# Patient Record
Sex: Female | Born: 1965 | Race: Asian | Hispanic: No | Marital: Married | Smoking: Never smoker
Health system: Southern US, Community
[De-identification: ages and names within clinical notes are randomized; demographics above are authoritative.]

## PROBLEM LIST (undated history)

## (undated) DIAGNOSIS — I10 Essential (primary) hypertension: Secondary | ICD-10-CM

## (undated) DIAGNOSIS — E785 Hyperlipidemia, unspecified: Secondary | ICD-10-CM

## (undated) DIAGNOSIS — K219 Gastro-esophageal reflux disease without esophagitis: Secondary | ICD-10-CM

## (undated) HISTORY — PX: NO PAST SURGERIES: SHX2092

## (undated) HISTORY — DX: Gastro-esophageal reflux disease without esophagitis: K21.9

## (undated) HISTORY — DX: Essential (primary) hypertension: I10

## (undated) HISTORY — DX: Hyperlipidemia, unspecified: E78.5

---

## 2018-02-27 ENCOUNTER — Other Ambulatory Visit: Payer: Self-pay | Admitting: Internal Medicine

## 2018-02-27 DIAGNOSIS — R921 Mammographic calcification found on diagnostic imaging of breast: Secondary | ICD-10-CM

## 2018-03-05 ENCOUNTER — Ambulatory Visit
Admission: RE | Admit: 2018-03-05 | Discharge: 2018-03-05 | Disposition: A | Discharge status: Home / Self Care | Payer: BLUE CROSS/BLUE SHIELD | Source: Ambulatory Visit | Attending: Internal Medicine | Admitting: Internal Medicine

## 2018-03-05 DIAGNOSIS — R921 Mammographic calcification found on diagnostic imaging of breast: Secondary | ICD-10-CM

## 2018-03-08 ENCOUNTER — Other Ambulatory Visit: Payer: Self-pay | Admitting: General Surgery

## 2019-01-24 IMAGING — MG STEREOTACTIC CORE NEEDLE BIOPSY
6 series · 6 of 6 positions shown · non-contrast
Comparison: Previous exams.

ADDENDUM:
Pathology revealed FIBROCYSTIC CHANGES WITH CALCIFICATIONS of the
Left breast, lower outer. This was found to be concordant by Dr.
Neva Artiga.

Pathology results were discussed with the patient by telephone by
Sosup Tejoo, [REDACTED] at [facility] [[email or web address]].
The patient was instructed to return for annual screening
mammography. Imaging and pathology reports were faxed to [facility] on March 08, 2018.
Pathology results reported by Paige Del Villar, RN on 03/08/2018.
CLINICAL DATA: 51-year-old female with suspicious calcifications in
the lower outer left breast.
EXAM:
LEFT BREAST STEREOTACTIC CORE NEEDLE BIOPSY

[L (1 of 6)]
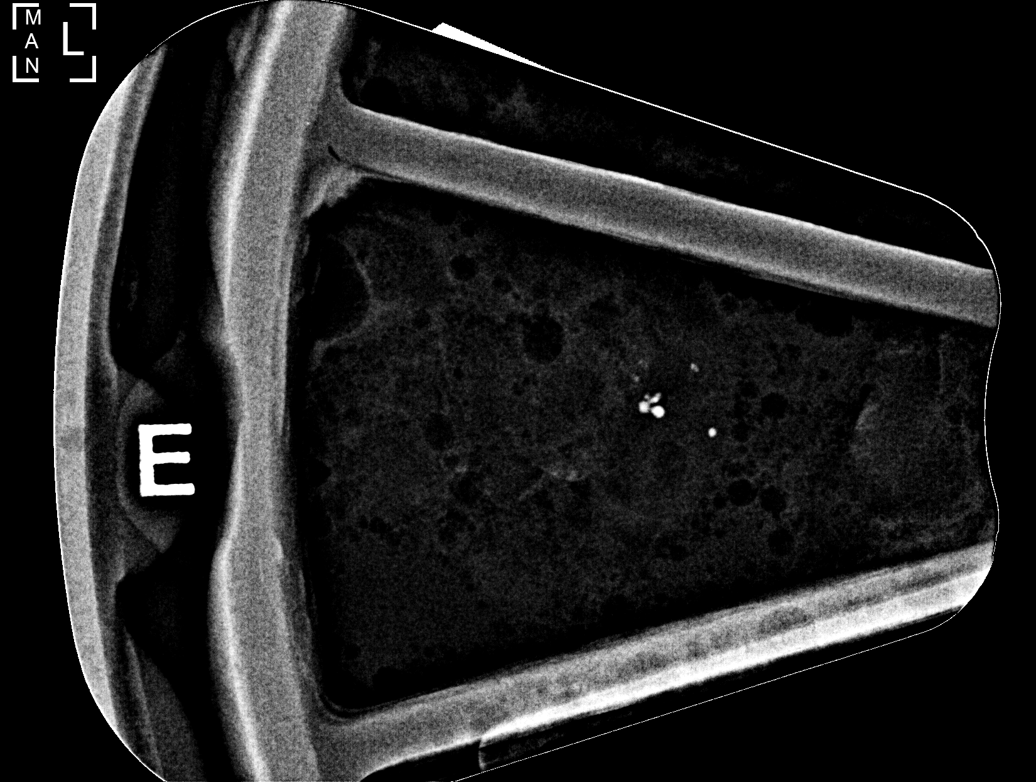

[L (2 of 6)]
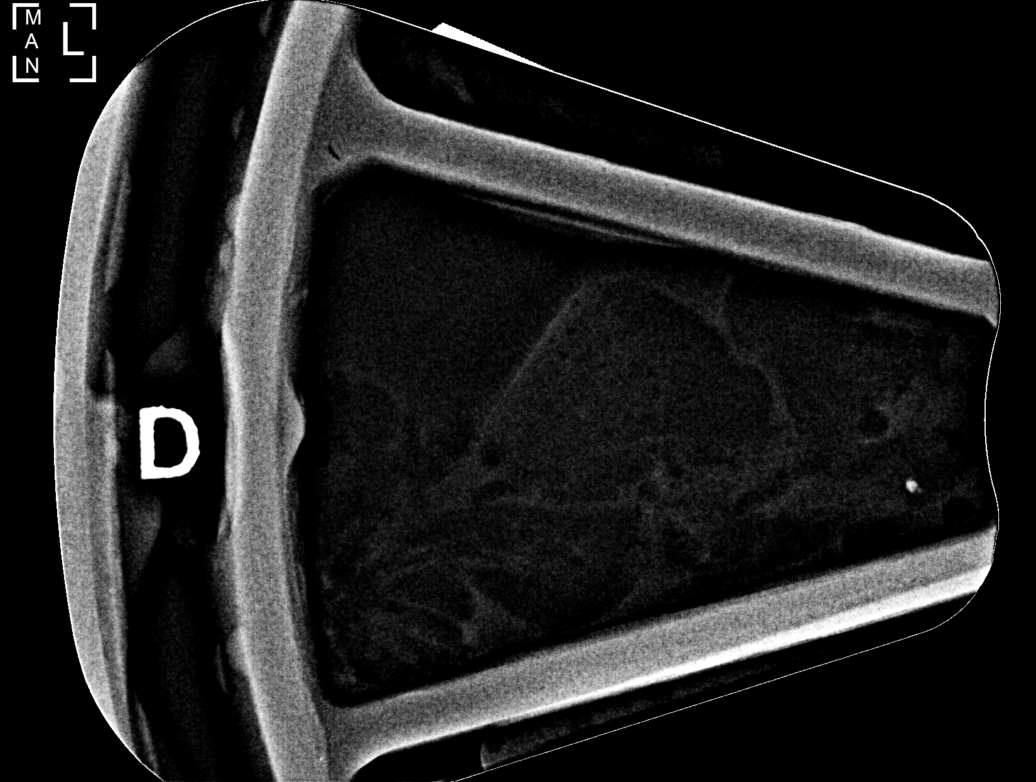

[L (3 of 6)]
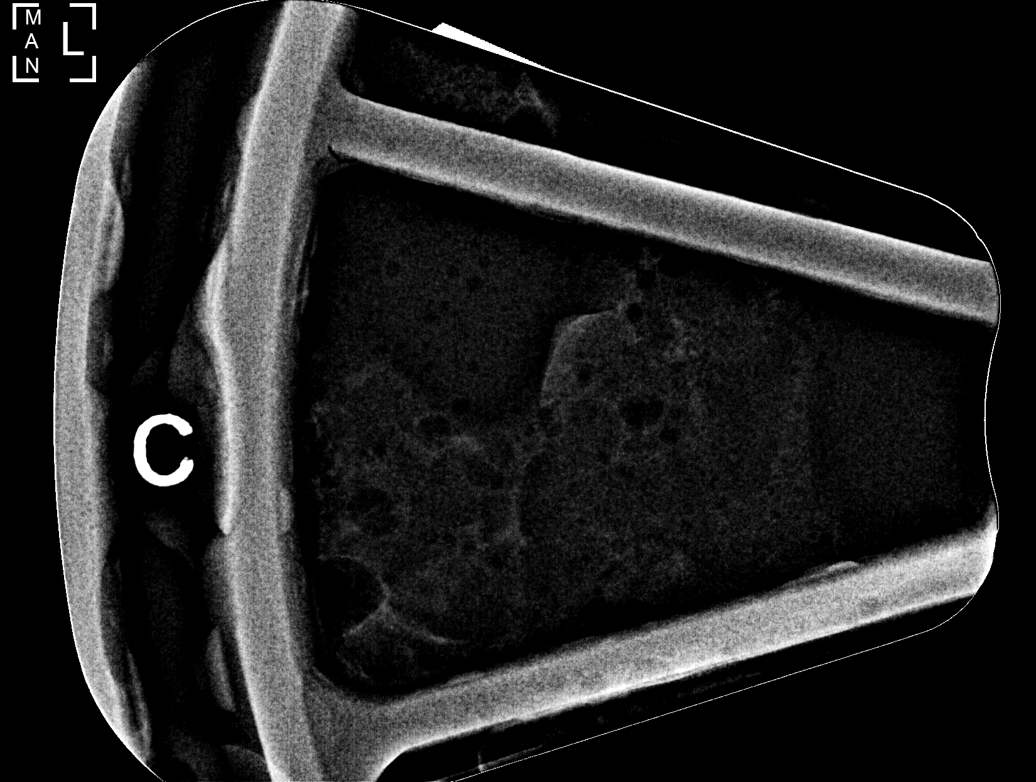

[L (4 of 6)]
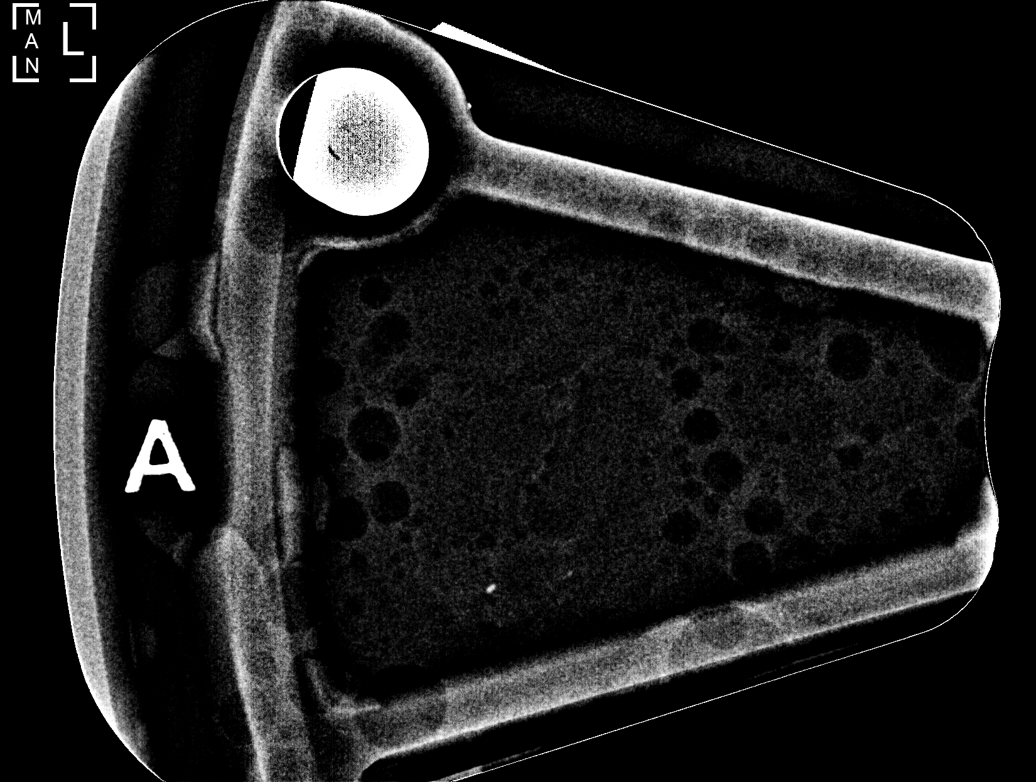

[L (5 of 6)]
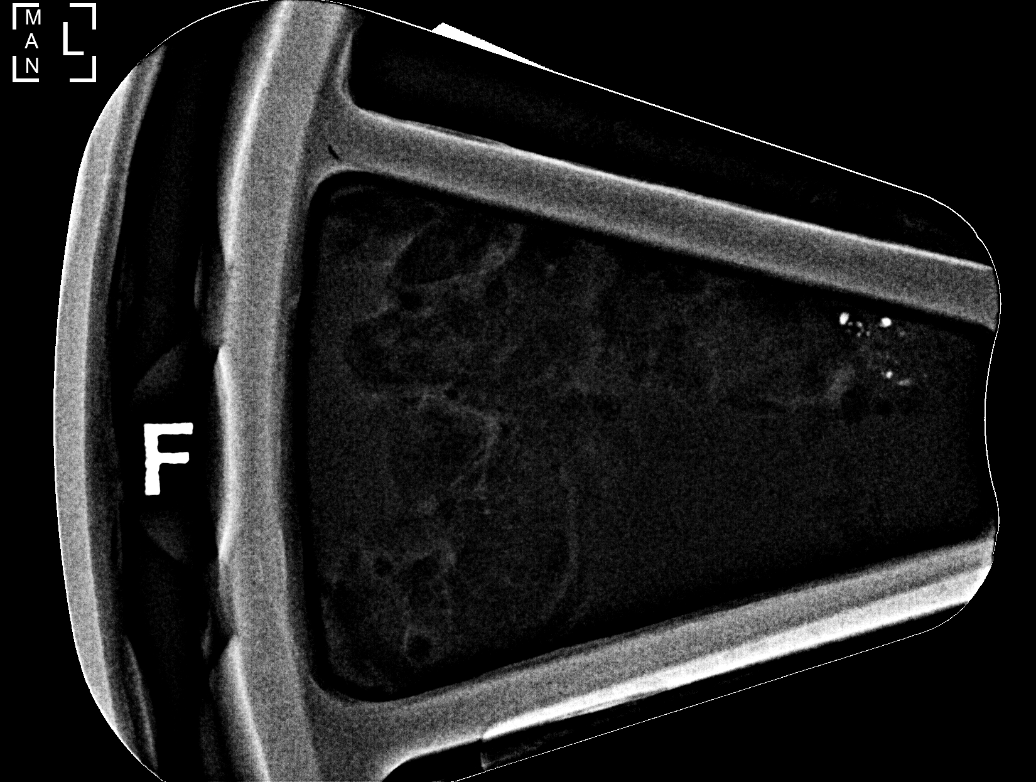

[L (6 of 6)]
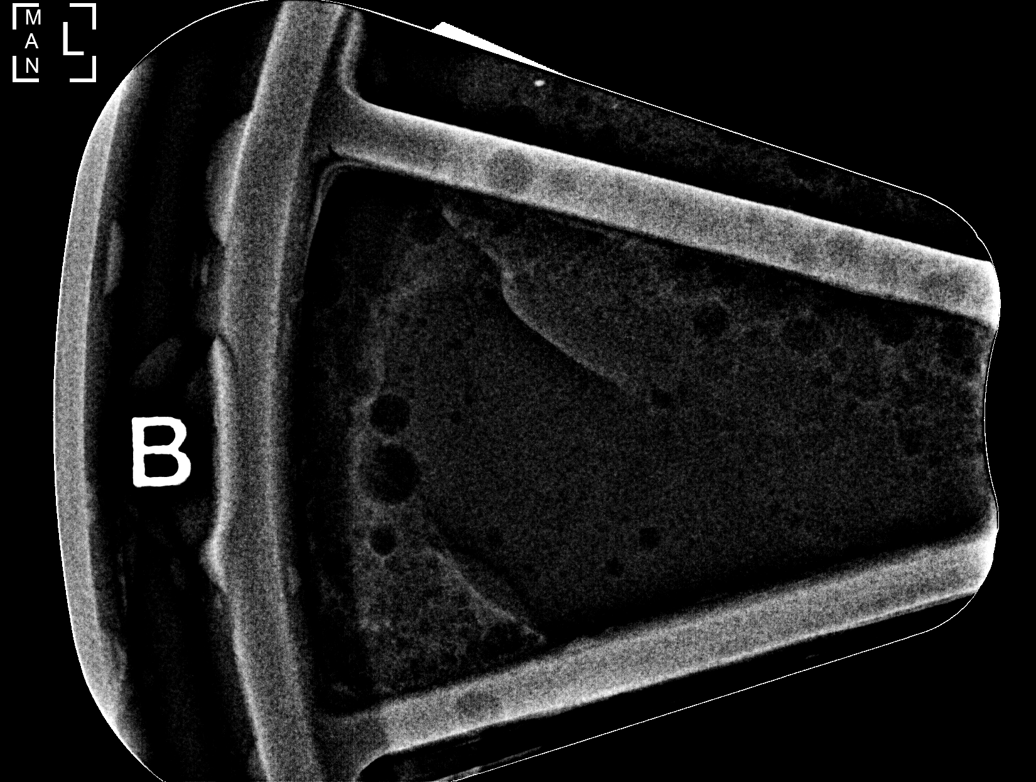

[6 of 6 positions shown; findings below may reference images not displayed]



Using sterile technique and 1% Lidocaine as local anesthetic, under
stereotactic guidance, a 9 gauge vacuum assisted device was used to
perform core needle biopsy of calcifications in the lower outer
quadrant of the left breast using a lateral to medial approach.
Specimen radiograph was performed showing the presence of
calcifications. Specimens with calcifications are identified for
pathology.

Lesion quadrant: Lower outer

At the conclusion of the procedure, a coil shaped tissue marker clip
was deployed into the biopsy cavity. Follow-up 2-view mammogram was
performed and dictated separately.
IMPRESSION: Stereotactic-guided biopsy of the calcifications in the lower outer
left breast. No apparent complications.

## 2019-03-15 ENCOUNTER — Other Ambulatory Visit: Payer: Self-pay

## 2019-03-15 DIAGNOSIS — Z20822 Contact with and (suspected) exposure to covid-19: Secondary | ICD-10-CM

## 2019-03-16 LAB — NOVEL CORONAVIRUS, NAA: SARS-CoV-2, NAA: DETECTED — AB

## 2019-03-17 ENCOUNTER — Telehealth: Payer: Self-pay | Admitting: Critical Care Medicine

## 2019-03-17 ENCOUNTER — Telehealth: Payer: Self-pay | Admitting: *Deleted

## 2019-03-17 NOTE — Telephone Encounter (Signed)
Husband called in however she was there with him for COVID-19 test result.   He spoke [place]. I let him know her COVID-19 test was positive meaning she had the coronavirus.   I had already gone over the instructions with him because he is positive too.   I let them know they both needed to stay quarantined until Sept 28 and if no symptoms have developed, and no fever with the use of Tylenol/ibuprofen they could stop the quarantine.  [facility] notified.

## 2019-03-17 NOTE — Telephone Encounter (Signed)
I tried to call but no answer, I will call again later.  The pt is aware of results.

## 2019-03-18 ENCOUNTER — Telehealth: Payer: Self-pay | Admitting: Critical Care Medicine

## 2019-03-18 NOTE — Telephone Encounter (Signed)
I connected with this patient who tested positive for COVID from a September 19 testing event.  The patient does have a primary care provider and has followed up with the primary care provider.  The patient is having minimal symptoms at this time.  Her husband also was positive and I just spoke to him on the phone.  They both understand the self-isolation needs and understand health department may be in touch.  Since the patient has a primary care provider they will follow-up with that practice and the practice is aware the patient is positive for COVID

## 2019-09-15 ENCOUNTER — Ambulatory Visit: Payer: BLUE CROSS/BLUE SHIELD | Attending: Internal Medicine

## 2019-09-15 DIAGNOSIS — Z23 Encounter for immunization: Secondary | ICD-10-CM

## 2019-09-15 NOTE — Progress Notes (Signed)
   Covid-19 Vaccination Clinic  Name:  Terri Rose    MRN: [id number] DOB: [birth year]  09/15/2019  Terri Rose was observed post Covid-19 immunization for 15 minutes without incident. She was provided with Vaccine Information Sheet and instruction to access the V-Safe system.   Terri Rose was instructed to call 911 with any severe reactions post vaccine: Marland Kitchen Difficulty breathing  . Swelling of face and throat  . A fast heartbeat  . A bad rash all over body  . Dizziness and weakness   Immunizations Administered    Name Date Dose VIS Date Route   Pfizer COVID-19 Vaccine 09/15/2019  8:17 AM 0.3 mL 06/06/2019 Intramuscular   Manufacturer: [employer], [employer]   Lot: [id number]   NDC: [id number]

## 2019-10-08 ENCOUNTER — Ambulatory Visit: Payer: BLUE CROSS/BLUE SHIELD | Attending: Internal Medicine

## 2019-10-08 DIAGNOSIS — Z23 Encounter for immunization: Secondary | ICD-10-CM

## 2019-10-08 NOTE — Progress Notes (Signed)
   Covid-19 Vaccination Clinic  Name:  COUNTESS BIEBEL    MRN: [id number] DOB: 1965/09/29  10/08/2019  Ms. Greeson was observed post Covid-19 immunization for 15 minutes without incident. She was provided with Vaccine Information Sheet and instruction to access the V-Safe system.   Ms. Furnari was instructed to call 911 with any severe reactions post vaccine: Marland Kitchen Difficulty breathing  . Swelling of face and throat  . A fast heartbeat  . A bad rash all over body  . Dizziness and weakness   Immunizations Administered    Name Date Dose VIS Date Route   Pfizer COVID-19 Vaccine 10/08/2019  9:26 AM 0.3 mL 06/06/2019 Intramuscular   Manufacturer: [employer], [employer]   Lot: [id number]   NDC: [address]-7

## 2019-12-02 LAB — HM MAMMOGRAPHY

## 2021-11-02 ENCOUNTER — Ambulatory Visit (HOSPITAL_BASED_OUTPATIENT_CLINIC_OR_DEPARTMENT_OTHER)
Admission: RE | Admit: 2021-11-02 | Discharge: 2021-11-02 | Disposition: A | Discharge status: Home / Self Care | Payer: 59 | Source: Ambulatory Visit | Attending: Family Medicine | Admitting: Family Medicine

## 2021-11-02 ENCOUNTER — Ambulatory Visit (INDEPENDENT_AMBULATORY_CARE_PROVIDER_SITE_OTHER): Payer: 59 | Admitting: Family Medicine

## 2021-11-02 ENCOUNTER — Encounter: Payer: Self-pay | Admitting: Family Medicine

## 2021-11-02 VITALS — BP 138/86 | HR 67 | Ht 62.0 in | Wt 137.0 lb

## 2021-11-02 DIAGNOSIS — K219 Gastro-esophageal reflux disease without esophagitis: Secondary | ICD-10-CM

## 2021-11-02 DIAGNOSIS — R6889 Other general symptoms and signs: Secondary | ICD-10-CM | POA: Diagnosis not present

## 2021-11-02 DIAGNOSIS — M25521 Pain in right elbow: Secondary | ICD-10-CM | POA: Insufficient documentation

## 2021-11-02 DIAGNOSIS — E782 Mixed hyperlipidemia: Secondary | ICD-10-CM | POA: Diagnosis not present

## 2021-11-02 DIAGNOSIS — I10 Essential (primary) hypertension: Secondary | ICD-10-CM | POA: Diagnosis not present

## 2021-11-02 DIAGNOSIS — Z1231 Encounter for screening mammogram for malignant neoplasm of breast: Secondary | ICD-10-CM

## 2021-11-02 DIAGNOSIS — E559 Vitamin D deficiency, unspecified: Secondary | ICD-10-CM

## 2021-11-02 MED ORDER — HYDROCHLOROTHIAZIDE 25 MG PO TABS: 25.0000 mg | ORAL_TABLET | Freq: Every day | ORAL | 1 refills | Status: AC

## 2021-11-02 MED ORDER — MELOXICAM 15 MG PO TABS: 15.0000 mg | ORAL_TABLET | Freq: Every day | ORAL | 0 refills | Status: AC

## 2021-11-02 MED ORDER — OMEPRAZOLE 20 MG PO CPDR: 20.0000 mg | DELAYED_RELEASE_CAPSULE | Freq: Every day | ORAL | 1 refills | Status: AC

## 2021-11-02 MED ORDER — LOVASTATIN 20 MG PO TABS: 20.0000 mg | ORAL_TABLET | Freq: Every day | ORAL | 1 refills | Status: AC

## 2021-11-02 MED ORDER — ATENOLOL 25 MG PO TABS: 25.0000 mg | ORAL_TABLET | Freq: Every day | ORAL | 1 refills | Status: AC
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
## 2021-11-02 NOTE — Patient Instructions (Signed)
Elbow pain: ?Adding meloxicam - do not take with any other antiinflammatories ?Rest, ice, heat, compression, home exercises (handout provided) ?X-ray today ?If not improving in 4-6 weeks, we can refer to sports medicine to further evaluate ?
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
## 2021-11-03 LAB — COMPREHENSIVE METABOLIC PANEL
ALT: 25 U/L (ref 0–35)
AST: 28 U/L (ref 0–37)
Albumin: 4.7 g/dL (ref 3.5–5.2)
Alkaline Phosphatase: 43 U/L (ref 39–117)
BUN: 18 mg/dL (ref 6–23)
CO2: 32 mEq/L (ref 19–32)
Calcium: 9.3 mg/dL (ref 8.4–10.5)
Chloride: 101 mEq/L (ref 96–112)
Creatinine, Ser: 0.77 mg/dL (ref 0.40–1.20)
GFR: 87.45 mL/min (ref >60.00)
Glucose, Bld: 82 mg/dL (ref 70–99)
Potassium: 4.2 mEq/L (ref 3.5–5.1)
Sodium: 140 mEq/L (ref 135–145)
Total Bilirubin: 0.6 mg/dL (ref 0.2–1.2)
Total Protein: 7.4 g/dL (ref 6.0–8.3)

## 2021-11-03 LAB — CBC
HCT: 40 % (ref 36.0–46.0)
Hemoglobin: 13.6 g/dL (ref 12.0–15.0)
MCHC: 33.9 g/dL (ref 30.0–36.0)
MCV: 88.2 fl (ref 78.0–100.0)
Platelets: 300 10*3/uL (ref 150.0–400.0)
RBC: 4.54 Mil/uL (ref 3.87–5.11)
RDW: 13.8 % (ref 11.5–15.5)
WBC: 6.4 10*3/uL (ref 4.0–10.5)

## 2021-11-03 LAB — LDL CHOLESTEROL, DIRECT: Direct LDL: 86 mg/dL

## 2021-11-03 LAB — TSH: TSH: 1.19 u[IU]/mL (ref 0.35–5.50)

## 2021-11-03 LAB — LIPID PANEL
Cholesterol: 172 mg/dL (ref 0–200)
HDL: 35.1 mg/dL — ABNORMAL LOW (ref >39.00)
Total CHOL/HDL Ratio: 5
Triglycerides: 651 mg/dL — ABNORMAL HIGH (ref 0.0–149.0)

## 2021-11-03 LAB — VITAMIN D 25 HYDROXY (VIT D DEFICIENCY, FRACTURES): VITD: 31.65 ng/mL (ref 30.00–100.00)

## 2021-11-04 ENCOUNTER — Encounter: Payer: Self-pay | Admitting: *Deleted

## 2021-11-07 NOTE — Addendum Note (Signed)
Addended by: Caleen Jobs B on: 11/07/2021 10:09 PM ? ? Modules accepted: Orders ? ?
